# Patient Record
Sex: Male | Born: 1980 | Race: White | Hispanic: No | Marital: Married | Smoking: Never smoker
Health system: Southern US, Community
[De-identification: ages and names within clinical notes are randomized; demographics above are authoritative.]

## PROBLEM LIST (undated history)

## (undated) DIAGNOSIS — J45909 Unspecified asthma, uncomplicated: Secondary | ICD-10-CM

## (undated) DIAGNOSIS — I499 Cardiac arrhythmia, unspecified: Secondary | ICD-10-CM

## (undated) HISTORY — PX: FRACTURE SURGERY: SHX138

---

## 2015-07-20 ENCOUNTER — Ambulatory Visit
Admission: EM | Admit: 2015-07-20 | Discharge: 2015-07-20 | Disposition: A | Discharge status: Home / Self Care | Payer: Managed Care, Other (non HMO)

## 2015-07-20 DIAGNOSIS — M542 Cervicalgia: Secondary | ICD-10-CM

## 2015-07-20 HISTORY — DX: Unspecified asthma, uncomplicated: J45.909

## 2015-07-20 HISTORY — DX: Cardiac arrhythmia, unspecified: I49.9
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
## 2016-07-01 DIAGNOSIS — F4320 Adjustment disorder, unspecified: Secondary | ICD-10-CM | POA: Diagnosis not present

## 2016-07-15 DIAGNOSIS — F4320 Adjustment disorder, unspecified: Secondary | ICD-10-CM | POA: Diagnosis not present

## 2016-08-02 DIAGNOSIS — F4320 Adjustment disorder, unspecified: Secondary | ICD-10-CM | POA: Diagnosis not present

## 2016-09-13 DIAGNOSIS — F4320 Adjustment disorder, unspecified: Secondary | ICD-10-CM | POA: Diagnosis not present

## 2016-10-18 DIAGNOSIS — F4320 Adjustment disorder, unspecified: Secondary | ICD-10-CM | POA: Diagnosis not present

## 2016-11-01 DIAGNOSIS — F4320 Adjustment disorder, unspecified: Secondary | ICD-10-CM | POA: Diagnosis not present

## 2016-12-13 DIAGNOSIS — F4320 Adjustment disorder, unspecified: Secondary | ICD-10-CM | POA: Diagnosis not present

## 2017-01-10 DIAGNOSIS — F4320 Adjustment disorder, unspecified: Secondary | ICD-10-CM | POA: Diagnosis not present

## 2017-03-13 ENCOUNTER — Ambulatory Visit (INDEPENDENT_AMBULATORY_CARE_PROVIDER_SITE_OTHER): Payer: BLUE CROSS/BLUE SHIELD

## 2017-03-13 ENCOUNTER — Ambulatory Visit
Admission: EM | Admit: 2017-03-13 | Discharge: 2017-03-13 | Disposition: A | Discharge status: Home / Self Care | Payer: BLUE CROSS/BLUE SHIELD | Attending: Emergency Medicine | Admitting: Emergency Medicine

## 2017-03-13 ENCOUNTER — Inpatient Hospital Stay: Admit: 2017-03-13 | Payer: Self-pay

## 2017-03-13 ENCOUNTER — Encounter: Payer: Self-pay | Admitting: Emergency Medicine

## 2017-03-13 DIAGNOSIS — S161XXA Strain of muscle, fascia and tendon at neck level, initial encounter: Secondary | ICD-10-CM

## 2017-03-13 DIAGNOSIS — M542 Cervicalgia: Secondary | ICD-10-CM

## 2017-03-13 NOTE — ED Triage Notes (Signed)
Patient c/o neck pain that started a week ago and radiates to his right shoulder.  Patient denies injury or fall.

## 2017-03-13 NOTE — ED Provider Notes (Signed)
CSN: [id number]     Arrival date & time 03/13/17  1340 History   First MD Initiated Contact with Patient 03/13/17 1444     Chief Complaint  Patient presents with  . Neck Pain   (Consider location/radiation/quality/duration/timing/severity/associated sxs/prior Treatment) HPI   36 year old male who presents with neck pain about a week ago. No known injury. Never had this neck pain before. It radiates intrascapularly on the right. He has no upper extremity radicular symptoms. Denies incontinence.       Past Medical History:  Diagnosis Date  . Asthma   . Irregular heart beat    Past Surgical History:  Procedure Laterality Date  . FRACTURE SURGERY     History reviewed. No pertinent family history. Social History  Substance Use Topics  . Smoking status: Never Smoker  . Smokeless tobacco: Never Used  . Alcohol use Yes     Comment: socially    Review of Systems  Constitutional: Positive for activity change. Negative for appetite change, chills, fatigue and fever.  Musculoskeletal: Positive for neck pain.  All other systems reviewed and are negative.   Allergies  Patient has no known allergies.  Home Medications   Prior to Admission medications   Not on File   Meds Ordered and Administered this Visit  Medications - No data to display  BP 130/76 (BP Location: Left Arm)   Pulse 60   Temp 98.4 F (36.9 C) (Oral)   Resp 16   Ht 6\' 2"  (1.88 m)   Wt 210 lb (95.3 kg)   SpO2 99%   BMI 26.96 kg/m  No data found.   Physical Exam  Constitutional: He is oriented to person, place, and time. He appears well-developed and well-nourished. No distress.  HENT:  Head: Normocephalic.  Eyes: Pupils are equal, round, and reactive to light. Right eye exhibits no discharge. Left eye exhibits no discharge.  Neck: Normal range of motion. Neck supple.  Examination of the cervical spine shows minimal decreased range of motion with discomfort at the extremes. There is tenderness to  palpation of the paraspinous muscles from C5 into the upper thoracic area. He is also tender along the paraspinous muscles of the thoracic spine T1 through approximately T3. Upper extremity strength is intact to clinical testing. Sensation is intact to light touch throughout. DTRs are 2+ over 4 and symmetrical in the upper extremities.  Musculoskeletal: Normal range of motion.  Refer to neck exam  Neurological: He is alert and oriented to person, place, and time. He displays normal reflexes. No sensory deficit. He exhibits normal muscle tone. Coordination normal.  Skin: Skin is warm and dry. He is not diaphoretic.  Psychiatric: He has a normal mood and affect. His behavior is normal. Judgment and thought content normal.  Nursing note and vitals reviewed.   Urgent Care Course     Procedures (including critical care time)  Labs Review Labs Reviewed - No data to display  Imaging Review Dg Cervical Spine Complete  Result Date: 03/13/2017 CLINICAL DATA:  Patient c/o posterior neck pain down to T2, per pt. Pain started a week ago and radiates to his right shoulder. Patient denies injury or fall. EXAM: CERVICAL SPINE - COMPLETE 4+ VIEW COMPARISON:  None. FINDINGS: There is no evidence of cervical spine fracture or prevertebral soft tissue swelling. Alignment is normal. No other significant bone abnormalities are identified. IMPRESSION: Negative cervical spine radiographs. Electronically Signed   By: Norva PavlovElizabeth  Brown M.D.   On: 03/13/2017 15:27  Visual Acuity Review  Right Eye Distance:   Left Eye Distance:   Bilateral Distance:    Right Eye Near:   Left Eye Near:    Bilateral Near:         MDM   1. Acute strain of neck muscle, initial encounter    There are no discharge medications for this patient. Patient refused prescriptive medications and prefers to use over-the-counter medication.  Plan: 1. Test/x-ray results and diagnosis reviewed with patient 2. rx as per orders;  risks, benefits, potential side effects reviewed with patient 3. Recommend supportive treatment with rest and symptom avoidance as necessary for comfort. Recommended anti-inflammatory medications and he prefers over-the-counter medications only. He did not wish to have any muscle relaxants. He continues heat alternating with ice for comfort. Also consider the use of Biofreeze topical. Is not improving he should follow-up with the primary care physician. 4. F/u prn if symptoms worsen or don't improve     Lutricia FeilRoemer, Kirstine Jacquin P, PA-C 03/13/17 1610

## 2018-01-13 DIAGNOSIS — R221 Localized swelling, mass and lump, neck: Secondary | ICD-10-CM | POA: Diagnosis not present

## 2018-01-15 DIAGNOSIS — L720 Epidermal cyst: Secondary | ICD-10-CM | POA: Diagnosis not present

## 2019-02-20 IMAGING — CR DG CERVICAL SPINE COMPLETE 4+V
8 series · 8 of 8 positions shown · non-contrast
Comparison: None.

CLINICAL DATA: Patient c/o posterior neck pain down to T2, per pt.
Pain started a week ago and radiates to his right shoulder. Patient
denies injury or fall.

EXAM:
CERVICAL SPINE - COMPLETE 4+ VIEW

[c-spine lat]
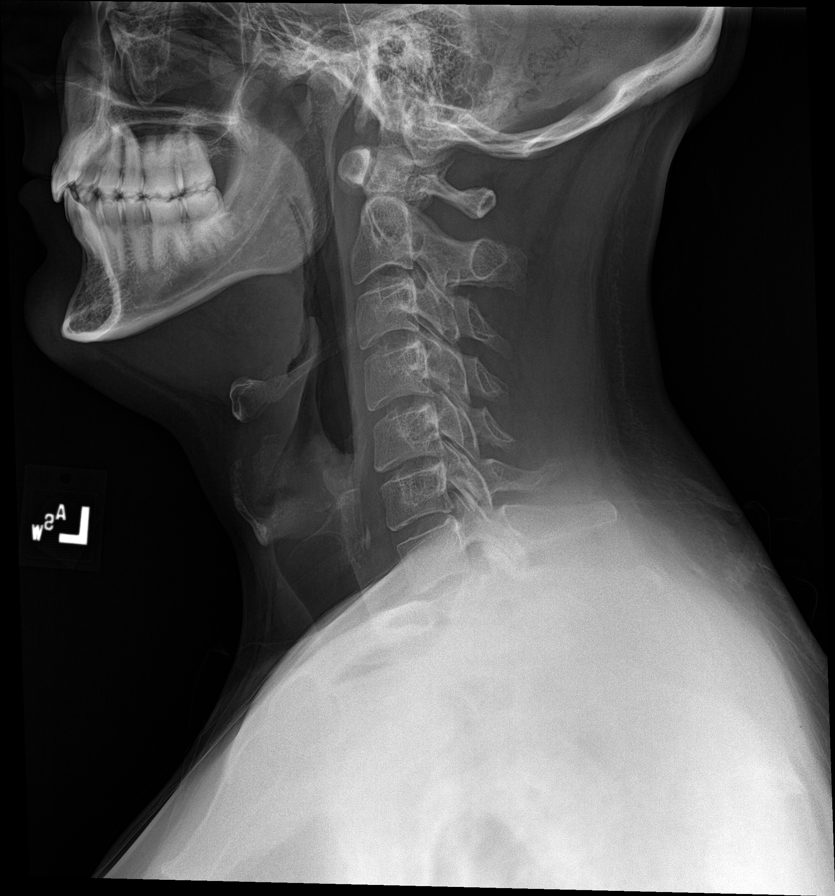

[c-spine obl (1 of 2)]
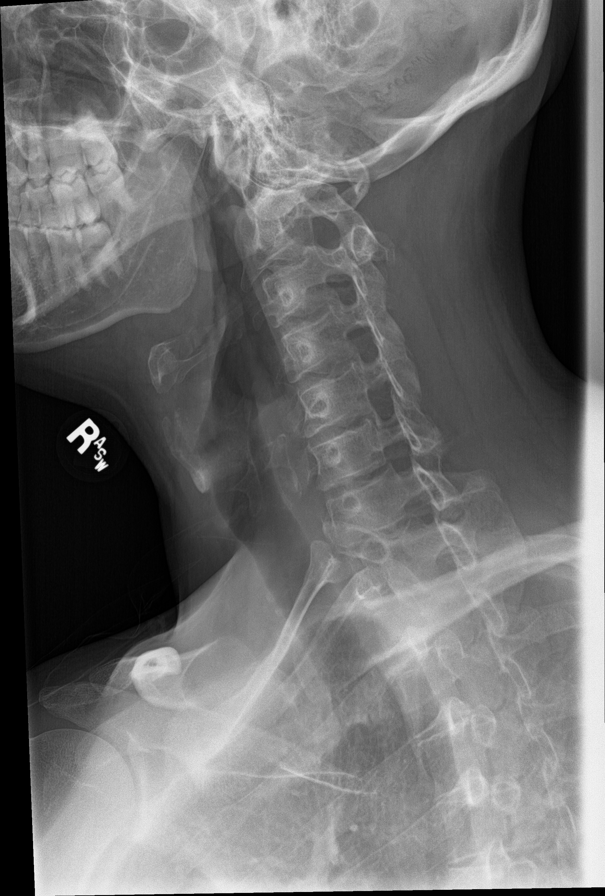

[c-spine obl (2 of 2)]
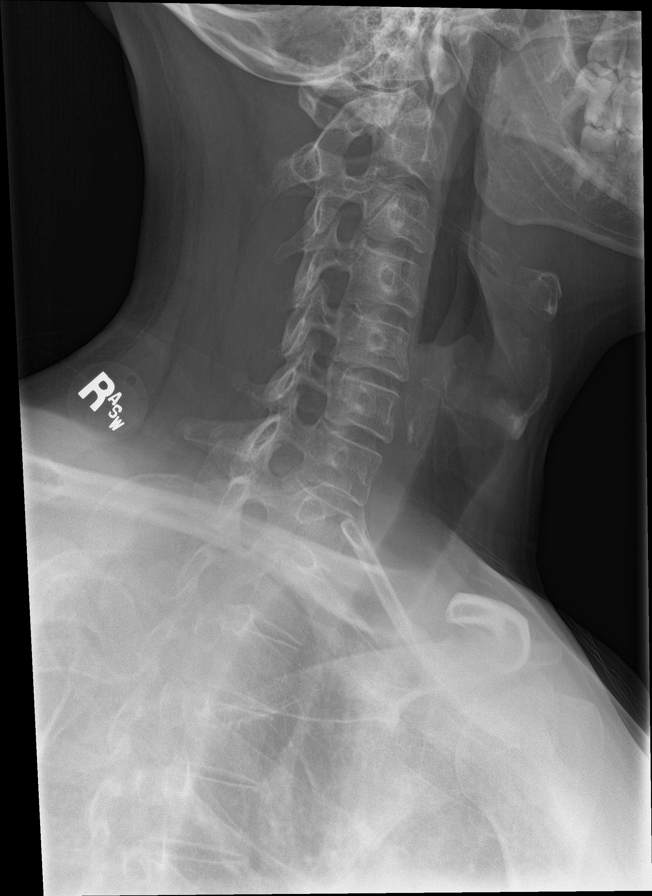

[c-spine ap]
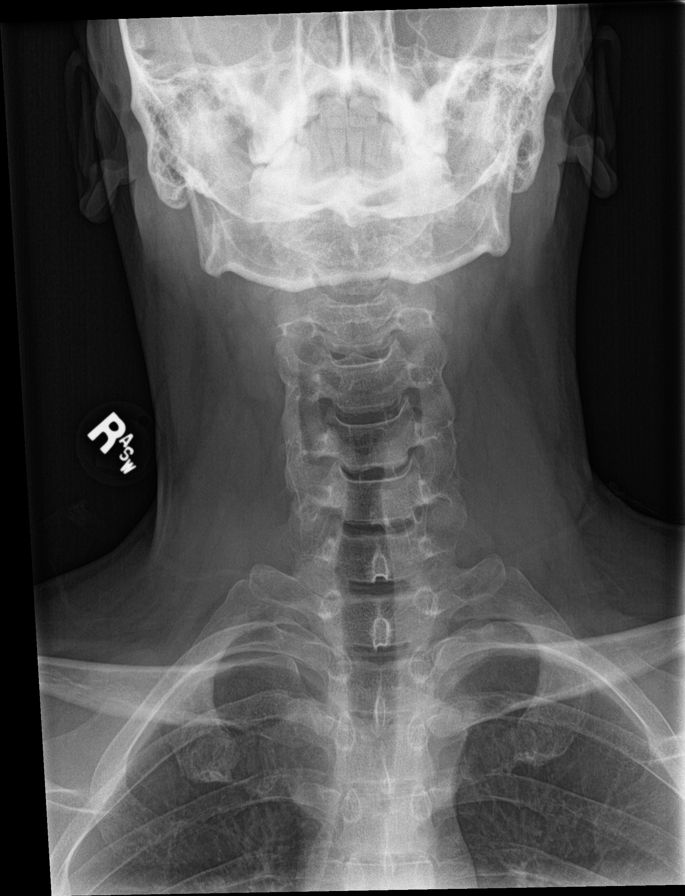

[c-spine open mouth (1 of 2)]
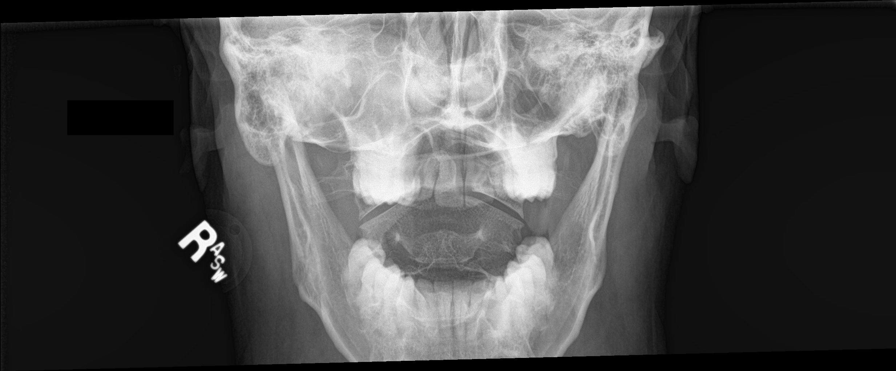

[ct-spine swimmers (1 of 2)]
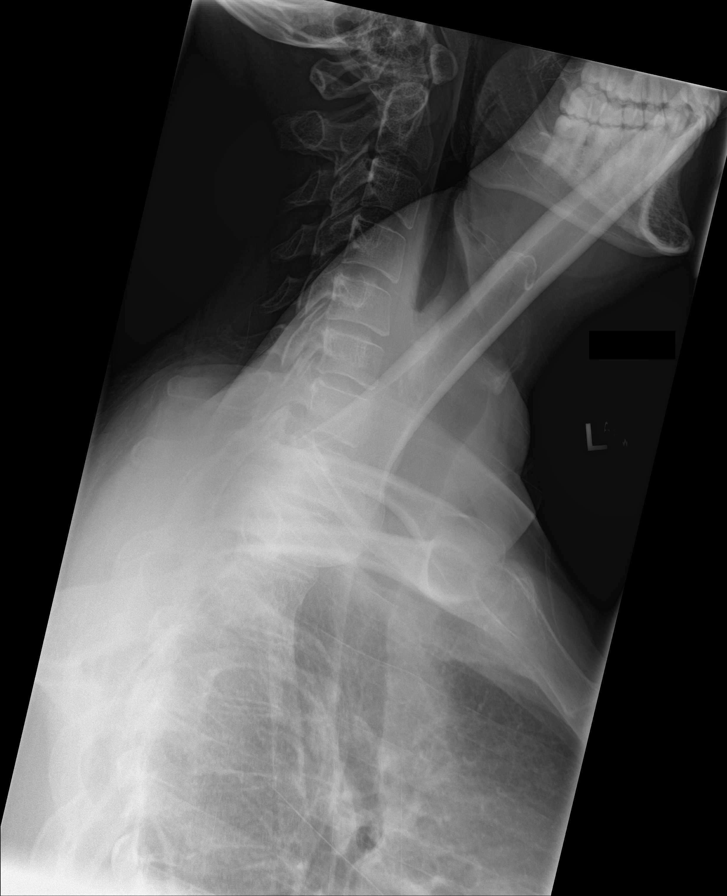

[ct-spine swimmers (2 of 2)]
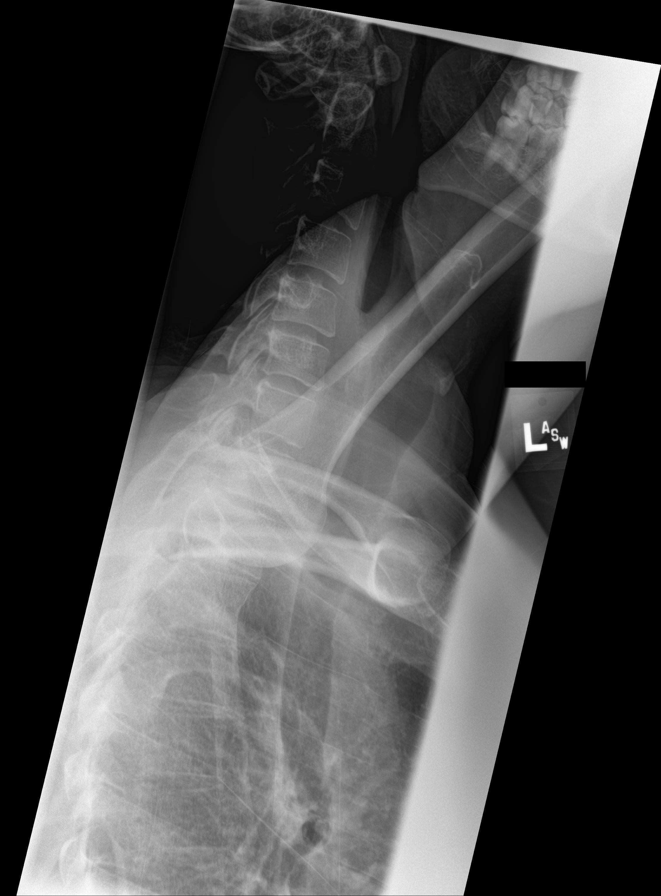

[c-spine open mouth (2 of 2)]
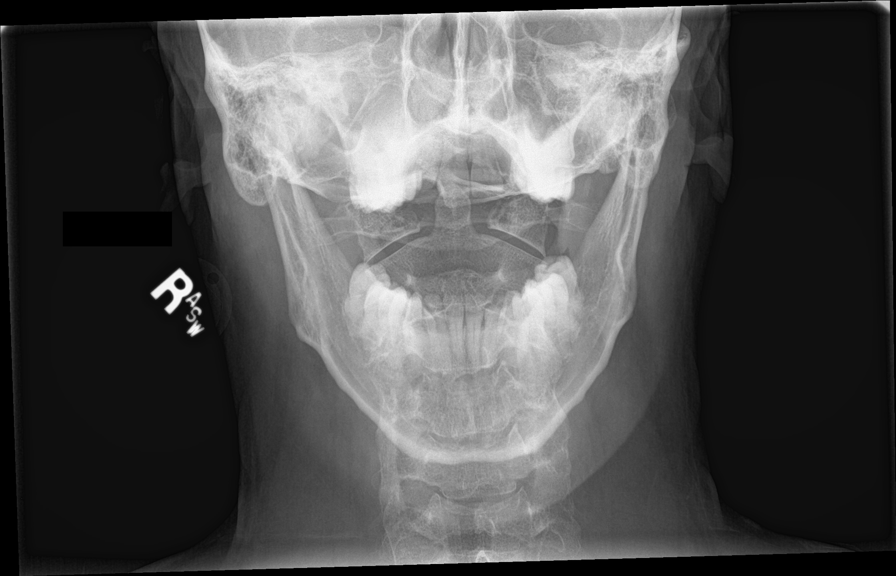

[8 of 8 positions shown; findings below may reference images not displayed]

FINDINGS: There is no evidence of cervical spine fracture or prevertebral soft
tissue swelling. Alignment is normal. No other significant bone
abnormalities are identified.
IMPRESSION: Negative cervical spine radiographs.

## 2019-11-04 ENCOUNTER — Ambulatory Visit: Payer: Self-pay | Attending: Internal Medicine

## 2019-11-04 DIAGNOSIS — Z23 Encounter for immunization: Secondary | ICD-10-CM

## 2019-11-04 NOTE — Progress Notes (Signed)
   Covid-19 Vaccination Clinic  Name:  Bradley Swanson    MRN: [id number] DOB: Jul 17, 1981  11/04/2019  [name] was observed post Covid-19 immunization for 15 minutes without incident. He was provided with Vaccine Information Sheet and instruction to access the V-Safe system.   [name] was instructed to call 911 with any severe reactions post vaccine: Marland Kitchen Difficulty breathing  . Swelling of face and throat  . A fast heartbeat  . A bad rash all over body  . Dizziness and weakness   Immunizations Administered    Name Date Dose VIS Date Route   Pfizer COVID-19 Vaccine 11/04/2019  8:25 AM 0.3 mL 07/30/2019 Intramuscular   Manufacturer: [employer], [employer]   Lot: [id number]   NDC: [address]-9

## 2019-11-29 ENCOUNTER — Ambulatory Visit: Payer: Self-pay | Attending: Internal Medicine

## 2019-11-29 DIAGNOSIS — Z23 Encounter for immunization: Secondary | ICD-10-CM

## 2019-11-29 NOTE — Progress Notes (Signed)
   Covid-19 Vaccination Clinic  Name:  AASHIR UMHOLTZ    MRN: [id number] DOB: 08-05-1981  11/29/2019  Mr. Fleener was observed post Covid-19 immunization for 15 minutes without incident. He was provided with Vaccine Information Sheet and instruction to access the V-Safe system.   Mr. Strick was instructed to call 911 with any severe reactions post vaccine: Marland Kitchen Difficulty breathing  . Swelling of face and throat  . A fast heartbeat  . A bad rash all over body  . Dizziness and weakness   Immunizations Administered    Name Date Dose VIS Date Route   Pfizer COVID-19 Vaccine 11/29/2019  8:18 AM 0.3 mL 07/30/2019 Intramuscular   Manufacturer: [employer], [employer]   Lot: [id number]   NDC: [address]-2
